# Patient Record
Sex: Male | Born: 1990 | Race: White | Hispanic: No | Marital: Single | Smoking: Light tobacco smoker
Health system: Southern US, Community
[De-identification: ages and names within clinical notes are randomized; demographics above are authoritative.]

---
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
## 2010-03-23 IMAGING — CR DG CHEST 2V
2 series · 2 of 2 positions shown · non-contrast
Comparison: 10/29/2003

CLINICAL DATA: Cough

CHEST - 2 VIEW

[view not recorded (1 of 2)]
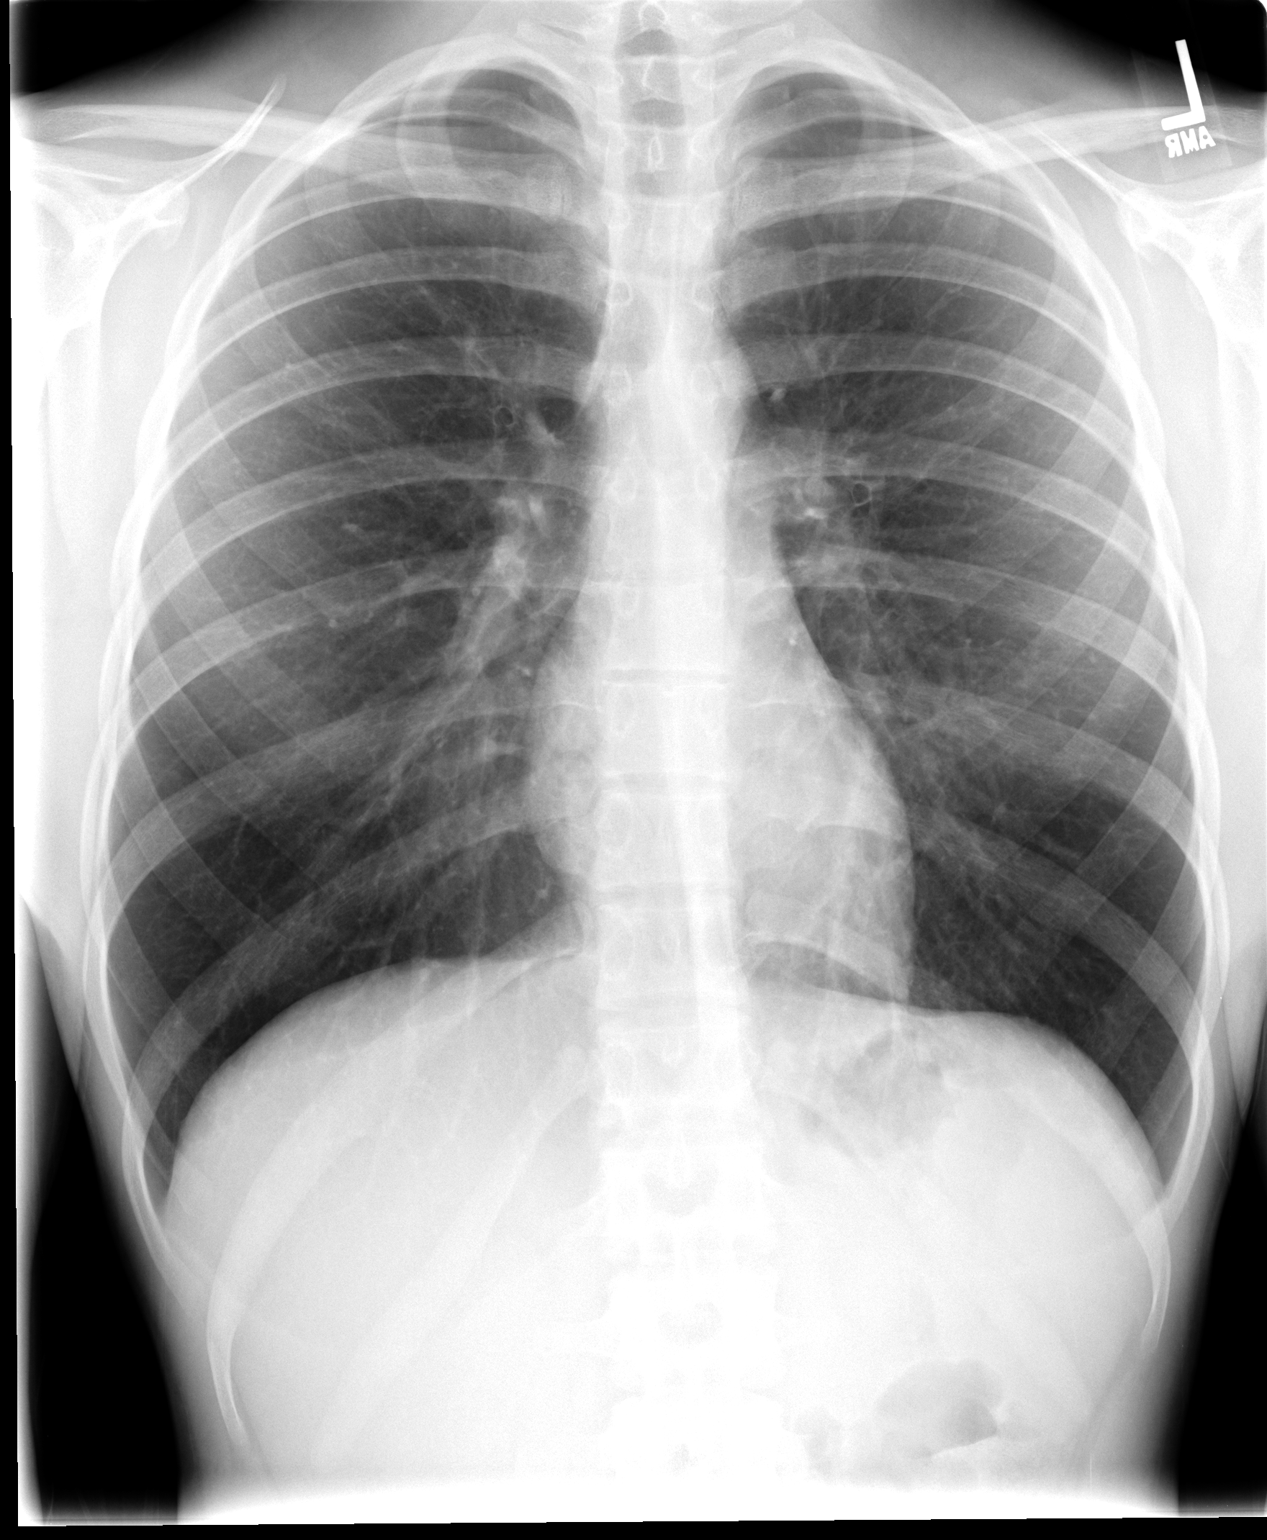

[view not recorded (2 of 2)]
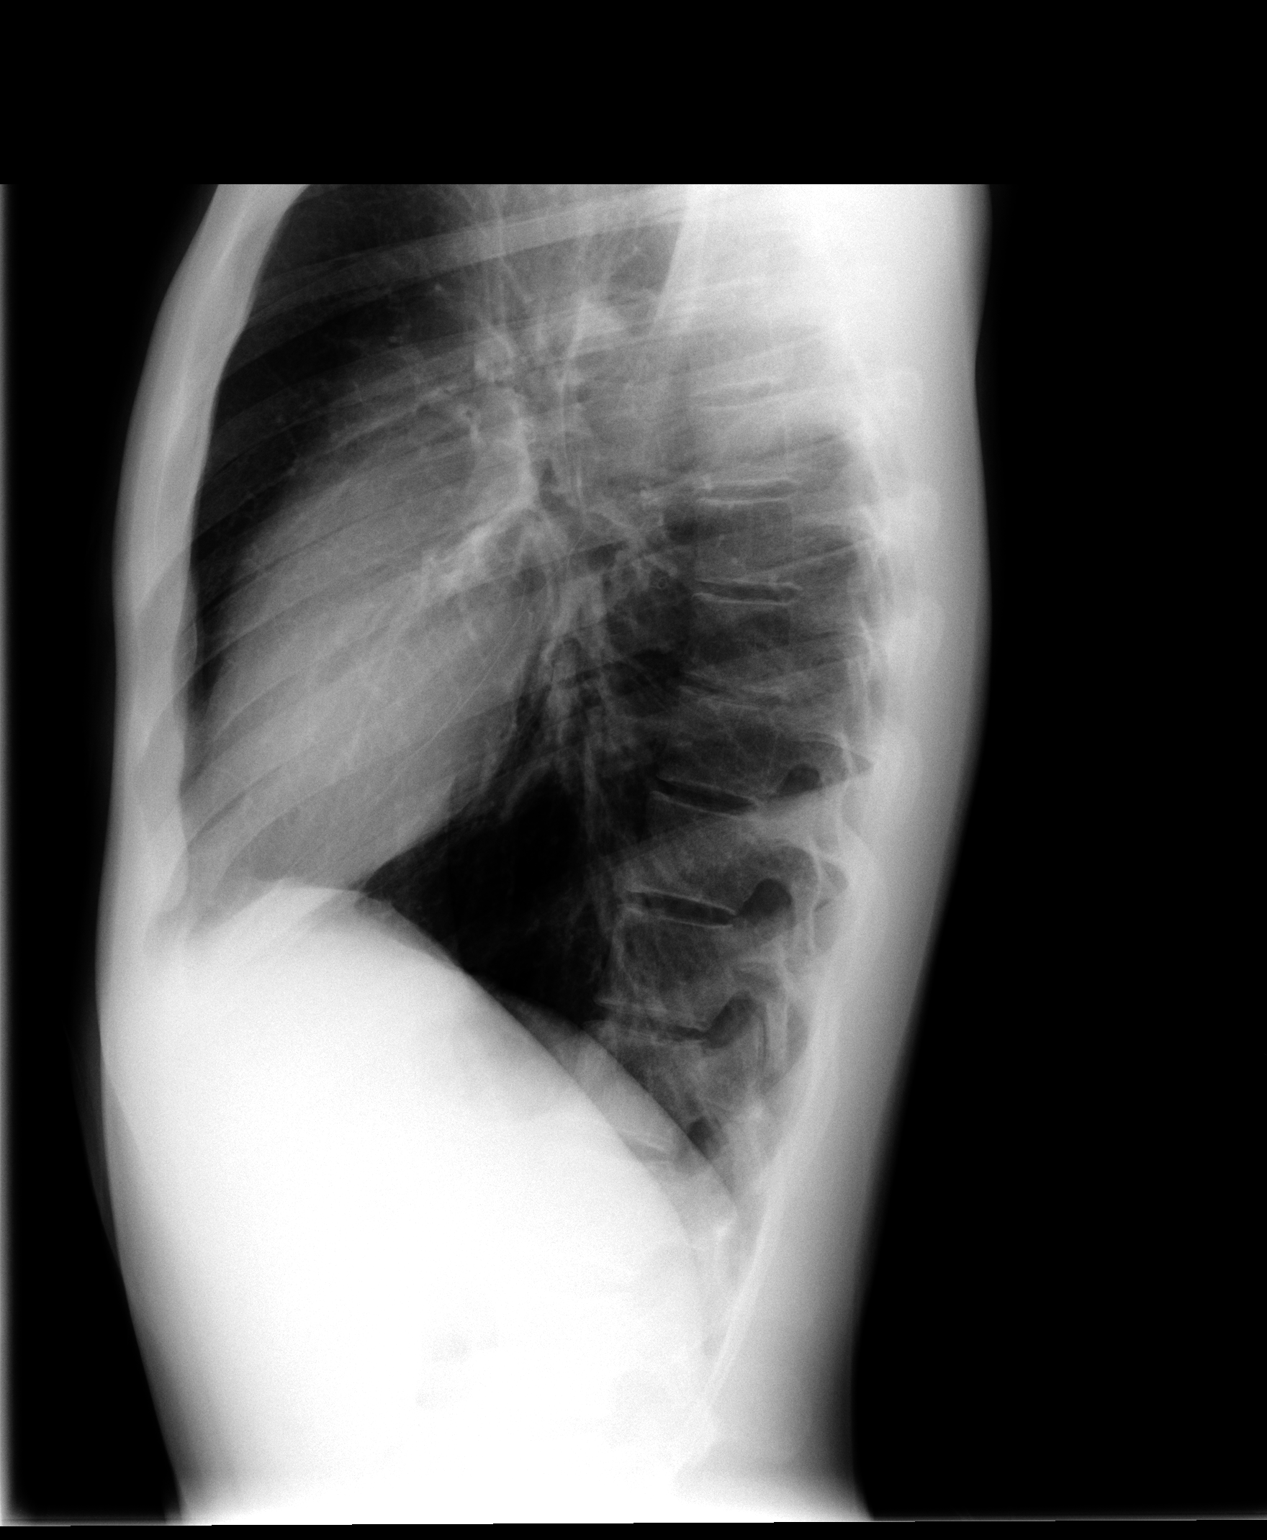

[2 of 2 positions shown; findings below may reference images not displayed]

FINDINGS: Two views of the chest demonstrate clear lungs.  Trachea
is midline.  The heart and mediastinum are within normal limits.
No evidence for focal disease.
IMPRESSION: No acute cardiopulmonary disease.
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
## 2010-11-05 LAB — CBC
HCT: 46.2
Hemoglobin: 15.5
MCHC: 33.5
MCV: 92
Platelets: 178
RBC: 5.03
RDW: 13
WBC: 10.1

## 2010-11-05 LAB — URINALYSIS, ROUTINE W REFLEX MICROSCOPIC
Bilirubin Urine: NEGATIVE
Glucose, UA: NEGATIVE
Hgb urine dipstick: NEGATIVE
Ketones, ur: NEGATIVE
Nitrite: NEGATIVE
Protein, ur: NEGATIVE
Specific Gravity, Urine: 1.02
Urobilinogen, UA: 0.2
pH: 7

## 2010-11-05 LAB — DIFFERENTIAL
Basophils Absolute: 0
Basophils Relative: 0
Eosinophils Absolute: 0 — ABNORMAL LOW
Eosinophils Relative: 0
Lymphocytes Relative: 5 — ABNORMAL LOW
Lymphs Abs: 0.5 — ABNORMAL LOW
Monocytes Absolute: 0.4
Monocytes Relative: 4
Neutro Abs: 9.2 — ABNORMAL HIGH
Neutrophils Relative %: 91 — ABNORMAL HIGH

## 2010-11-05 LAB — BASIC METABOLIC PANEL
BUN: 16
CO2: 30
Calcium: 9.2
Chloride: 103
Creatinine, Ser: 0.92
Glucose, Bld: 118 — ABNORMAL HIGH
Potassium: 4.2
Sodium: 139

## 2016-09-08 ENCOUNTER — Emergency Department (HOSPITAL_COMMUNITY)
Admission: EM | Admit: 2016-09-08 | Discharge: 2016-09-08 | Disposition: A | Discharge status: Home / Self Care | Payer: Self-pay | Attending: Emergency Medicine | Admitting: Emergency Medicine

## 2016-09-08 ENCOUNTER — Encounter (HOSPITAL_COMMUNITY): Payer: Self-pay | Admitting: Emergency Medicine

## 2016-09-08 DIAGNOSIS — Y9389 Activity, other specified: Secondary | ICD-10-CM | POA: Insufficient documentation

## 2016-09-08 DIAGNOSIS — F17290 Nicotine dependence, other tobacco product, uncomplicated: Secondary | ICD-10-CM | POA: Insufficient documentation

## 2016-09-08 DIAGNOSIS — S81812A Laceration without foreign body, left lower leg, initial encounter: Secondary | ICD-10-CM | POA: Insufficient documentation

## 2016-09-08 DIAGNOSIS — Y9289 Other specified places as the place of occurrence of the external cause: Secondary | ICD-10-CM | POA: Insufficient documentation

## 2016-09-08 DIAGNOSIS — W25XXXA Contact with sharp glass, initial encounter: Secondary | ICD-10-CM | POA: Insufficient documentation

## 2016-09-08 DIAGNOSIS — Z23 Encounter for immunization: Secondary | ICD-10-CM | POA: Insufficient documentation

## 2016-09-08 DIAGNOSIS — Y999 Unspecified external cause status: Secondary | ICD-10-CM | POA: Insufficient documentation

## 2016-09-08 MED ORDER — LIDOCAINE HCL (PF) 1 % IJ SOLN
5.0000 mL | Freq: Once | INTRAMUSCULAR | Status: AC
  Administered 2016-09-08: 5 mL
  Filled 2016-09-08: qty 5

## 2016-09-08 MED ORDER — TETANUS-DIPHTH-ACELL PERTUSSIS 5-2.5-18.5 LF-MCG/0.5 IM SUSP
0.5000 mL | Freq: Once | INTRAMUSCULAR | Status: AC
  Administered 2016-09-08: 0.5 mL via INTRAMUSCULAR
  Filled 2016-09-08: qty 0.5

## 2016-09-08 MED ORDER — POVIDONE-IODINE 10 % EX SOLN
CUTANEOUS | Status: DC | PRN
  Filled 2016-09-08: qty 15
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
## 2016-09-08 NOTE — Discharge Instructions (Signed)
Have your sutures removed in 10 days.  Keep your wound clean and dry,  until a good scab forms - you may then wash gently  daily with mild soap and water, but dry completely after.  Get rechecked for any sign of infection (redness,  Swelling,  Increased pain or drainage of purulent fluid). Ice, elevation and motrin will help you have any soreness after the numbness wears off.
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
